# Patient Record
Sex: Male | Born: 1995 | Race: Black or African American | Hispanic: No | Marital: Single | Smoking: Current every day smoker
Health system: Southern US, Community
[De-identification: ages and names within clinical notes are randomized; demographics above are authoritative.]

## PROBLEM LIST (undated history)

## (undated) DIAGNOSIS — Y249XXA Unspecified firearm discharge, undetermined intent, initial encounter: Secondary | ICD-10-CM

## (undated) DIAGNOSIS — W3400XA Accidental discharge from unspecified firearms or gun, initial encounter: Secondary | ICD-10-CM

## (undated) HISTORY — PX: CORNEAL TRANSPLANT: SHX108

---
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
## 2016-11-06 ENCOUNTER — Emergency Department (HOSPITAL_COMMUNITY): Payer: Self-pay

## 2016-11-06 ENCOUNTER — Encounter (HOSPITAL_COMMUNITY): Payer: Self-pay | Admitting: Emergency Medicine

## 2016-11-06 ENCOUNTER — Emergency Department (HOSPITAL_COMMUNITY)
Admission: EM | Admit: 2016-11-06 | Discharge: 2016-11-06 | Disposition: A | Discharge status: Home / Self Care | Payer: Self-pay | Attending: Emergency Medicine | Admitting: Emergency Medicine

## 2016-11-06 DIAGNOSIS — R112 Nausea with vomiting, unspecified: Secondary | ICD-10-CM | POA: Insufficient documentation

## 2016-11-06 DIAGNOSIS — F17210 Nicotine dependence, cigarettes, uncomplicated: Secondary | ICD-10-CM | POA: Insufficient documentation

## 2016-11-06 DIAGNOSIS — R197 Diarrhea, unspecified: Secondary | ICD-10-CM | POA: Insufficient documentation

## 2016-11-06 DIAGNOSIS — Z202 Contact with and (suspected) exposure to infections with a predominantly sexual mode of transmission: Secondary | ICD-10-CM | POA: Insufficient documentation

## 2016-11-06 DIAGNOSIS — R1084 Generalized abdominal pain: Secondary | ICD-10-CM | POA: Insufficient documentation

## 2016-11-06 LAB — COMPREHENSIVE METABOLIC PANEL
ALBUMIN: 4.5 g/dL (ref 3.5–5.0)
ALT: 19 U/L (ref 17–63)
AST: 22 U/L (ref 15–41)
Alkaline Phosphatase: 79 U/L (ref 38–126)
Anion gap: 9 (ref 5–15)
BUN: 11 mg/dL (ref 6–20)
CHLORIDE: 105 mmol/L (ref 101–111)
CO2: 22 mmol/L (ref 22–32)
Calcium: 9.8 mg/dL (ref 8.9–10.3)
Creatinine, Ser: 0.96 mg/dL (ref 0.61–1.24)
GFR calc Af Amer: >60 mL/min (ref >60)
GLUCOSE: 108 mg/dL — AB (ref 65–99)
POTASSIUM: 4.3 mmol/L (ref 3.5–5.1)
Sodium: 136 mmol/L (ref 135–145)
Total Bilirubin: 0.6 mg/dL (ref 0.3–1.2)
Total Protein: 8.3 g/dL — ABNORMAL HIGH (ref 6.5–8.1)

## 2016-11-06 LAB — CBC
HEMATOCRIT: 43.7 % (ref 39.0–52.0)
Hemoglobin: 15.5 g/dL (ref 13.0–17.0)
MCH: 30.6 pg (ref 26.0–34.0)
MCHC: 35.5 g/dL (ref 30.0–36.0)
MCV: 86.2 fL (ref 78.0–100.0)
Platelets: 262 10*3/uL (ref 150–400)
RBC: 5.07 MIL/uL (ref 4.22–5.81)
RDW: 12 % (ref 11.5–15.5)
WBC: 19 10*3/uL — AB (ref 4.0–10.5)

## 2016-11-06 LAB — URINALYSIS, ROUTINE W REFLEX MICROSCOPIC
BILIRUBIN URINE: NEGATIVE
GLUCOSE, UA: NEGATIVE mg/dL
HGB URINE DIPSTICK: NEGATIVE
Ketones, ur: 5 mg/dL — AB
Leukocytes, UA: NEGATIVE
Nitrite: NEGATIVE
PH: 6 (ref 5.0–8.0)
Protein, ur: NEGATIVE mg/dL
Specific Gravity, Urine: 1.024 (ref 1.005–1.030)

## 2016-11-06 LAB — RAPID STREP SCREEN (MED CTR MEBANE ONLY): Streptococcus, Group A Screen (Direct): NEGATIVE

## 2016-11-06 LAB — LIPASE, BLOOD: LIPASE: 27 U/L (ref 11–51)

## 2016-11-06 MED ORDER — ONDANSETRON HCL 4 MG/2ML IJ SOLN
4.0000 mg | Freq: Once | INTRAMUSCULAR | Status: AC
  Administered 2016-11-06: 4 mg via INTRAVENOUS
  Filled 2016-11-06: qty 2

## 2016-11-06 MED ORDER — SODIUM CHLORIDE 0.9 % IV BOLUS (SEPSIS)
1000.0000 mL | Freq: Once | INTRAVENOUS | Status: AC
  Administered 2016-11-06: 1000 mL via INTRAVENOUS

## 2016-11-06 MED ORDER — KETOROLAC TROMETHAMINE 30 MG/ML IJ SOLN
30.0000 mg | Freq: Once | INTRAMUSCULAR | Status: AC
Start: 2016-11-06 — End: 2016-11-06
  Administered 2016-11-06: 30 mg via INTRAVENOUS
  Filled 2016-11-06: qty 1

## 2016-11-06 MED ORDER — ONDANSETRON HCL 4 MG PO TABS: 4.0000 mg | ORAL_TABLET | Freq: Four times a day (QID) | ORAL | 0 refills | Status: DC

## 2016-11-06 MED ORDER — IOPAMIDOL (ISOVUE-300) INJECTION 61%
100.0000 mL | Freq: Once | INTRAVENOUS | Status: AC | PRN
  Administered 2016-11-06: 100 mL via INTRAVENOUS

## 2016-11-06 MED ORDER — IOPAMIDOL (ISOVUE-300) INJECTION 61%
INTRAVENOUS | Status: DC
Start: 2016-11-06 — End: 2016-11-07
  Filled 2016-11-06: qty 100

## 2016-11-06 MED ORDER — DICYCLOMINE HCL 20 MG PO TABS: 20.0000 mg | ORAL_TABLET | Freq: Two times a day (BID) | ORAL | 0 refills | Status: DC

## 2016-11-06 MED ORDER — STERILE WATER FOR INJECTION IJ SOLN
INTRAMUSCULAR | Status: AC
  Administered 2016-11-06: 10 mL
  Filled 2016-11-06: qty 10

## 2016-11-06 MED ORDER — AZITHROMYCIN 250 MG PO TABS
1000.0000 mg | ORAL_TABLET | Freq: Every day | ORAL | Status: DC
  Administered 2016-11-06: 1000 mg via ORAL
  Filled 2016-11-06: qty 4

## 2016-11-06 MED ORDER — CEFTRIAXONE SODIUM 250 MG IJ SOLR
250.0000 mg | Freq: Once | INTRAMUSCULAR | Status: AC
  Administered 2016-11-06: 250 mg via INTRAMUSCULAR
  Filled 2016-11-06: qty 250

## 2016-11-06 NOTE — ED Provider Notes (Signed)
[facility]-EMERGENCY DEPT Provider Note   CSN: [id number] Arrival date & time: 11/06/16  1309     History   Chief Complaint Chief Complaint  Patient presents with  . Abdominal Pain  . Emesis  . Diarrhea  . Fatigue  . Exposure to STD    HPI Marcus Hodges is a 21 y.o. male who presents with generalized, intermittent abdominal pain, nausea/vomiting, diarrhea. Patient reports that the symptoms have been ongoing for the past week but have been worsened in the last 2-3 days. He describes pain as a generalized cramping. Patient states that the pain will sometimes radiate up into the right lateral side. He states he has had multiple episodes of vomiting but states that emesis is nonbloody, nonbilious. Patient has been able to tolerate minimal liquids but states that he is unable to tolerate any other by mouth. Patient reports he has had multiple episodes of diarrhea since onset but denies any blood in stool or melena. Patient states that he has not taken anything for the pain. Patient reports that he has had subjective fever but has not measured any temperature. Patient states that he also has had several days of sore throat. He is still able to swallow denies any neck swelling. Patient also concerned about possible STD exposure. He states that his girlfriend has been tested positive for Chlamydia and is concerned that he may now be positive for STDs. Patient denies any penile discharge, dysuria. Patient denies any dysuria, hematuria.  Patient states that he does smoke cigarettes. Patient denies any cocaine, marijuana, heroine use.   The history is provided by the patient and a relative.    History reviewed. No pertinent past medical history.  There are no active problems to display for this patient.   History reviewed. No pertinent surgical history.     Home Medications    Prior to Admission medications   Medication Sig Start Date End Date Taking? Authorizing Provider  dicyclomine (BENTYL) 20  MG tablet Take 1 tablet (20 mg total) by mouth 2 (two) times daily. 11/06/16   Maxwell Caul, PA-C  ibuprofen (ADVIL,MOTRIN) 600 MG tablet Take 1 tablet (600 mg total) by mouth every 6 (six) hours as needed for pain. Patient not taking: Reported on 11/06/2016 07/30/12   Kathrynn Speed, PA-C  methocarbamol (ROBAXIN) 500 MG tablet Take 1 tablet (500 mg total) by mouth 2 (two) times daily. Patient not taking: Reported on 11/06/2016 07/30/12   Hess, Nada Boozer, PA-C  ondansetron (ZOFRAN) 4 MG tablet Take 1 tablet (4 mg total) by mouth every 6 (six) hours. 11/06/16   Maxwell Caul, PA-C    Family History History reviewed. No pertinent family history.  Social History Social History  Substance Use Topics  . Smoking status: Current Every Day Smoker    Packs/day: 1.00    Years: 6.00    Types: Cigarettes  . Smokeless tobacco: Never Used  . Alcohol use Yes     Comment: 2 pints     Allergies   Patient has no known allergies.   Review of Systems Review of Systems  Constitutional: Positive for fever (subjective). Negative for chills.  HENT: Positive for sore throat. Negative for congestion, drooling and trouble swallowing.   Respiratory: Negative for shortness of breath.   Cardiovascular: Positive for chest pain.  Gastrointestinal: Positive for abdominal pain, diarrhea, nausea and vomiting. Negative for blood in stool.  Genitourinary: Negative for discharge, dysuria, hematuria, penile pain, penile swelling and testicular pain.  Musculoskeletal: Negative for  back pain and neck pain.  Neurological: Negative for weakness, numbness and headaches.     Physical Exam Updated Vital Signs BP 119/71   Pulse 65   Temp 98.5 F (36.9 C) (Oral)   Resp (!) 25   SpO2 100%   Physical Exam  Constitutional: He is oriented to person, place, and time. He appears well-developed and well-nourished.  Appears uncomfortable but no acute distress   HENT:  Head: Normocephalic and atraumatic.  Mouth/Throat:  Uvula is midline and mucous membranes are normal. No trismus in the jaw. Posterior oropharyngeal edema and posterior oropharyngeal erythema present. No oropharyngeal exudate.  Uvula is midline. No trismus. No evidence of peritonsillar abscess. No facial or neck swelling. Posterior oropharynx is slightly edematous and erythematous. No evidence of exudate.  Eyes: Pupils are equal, round, and reactive to light. Conjunctivae, EOM and lids are normal.  Neck: Full passive range of motion without pain. Neck supple. No neck rigidity.  Full flexion/extension and lateral movement of neck fully intact. No bony midline tenderness. No deformities or crepitus.   Cardiovascular: Normal rate, regular rhythm, normal heart sounds and normal pulses.  Exam reveals no gallop and no friction rub.   No murmur heard. Pulmonary/Chest: Effort normal and breath sounds normal.  No evidence of respiratory distress. Able to speak in full sentences without difficulty.  Abdominal: Soft. Normal appearance. There is generalized tenderness. There is no rigidity, no guarding, no CVA tenderness and no tenderness at McBurney's point. Hernia confirmed negative in the right inguinal area and confirmed negative in the left inguinal area.  Abdomen is soft, nondistended. He has diffuse generalized tenderness.  Genitourinary: Testes normal and penis normal. Right testis shows no swelling and no tenderness. Left testis shows no swelling and no tenderness. Circumcised.  Genitourinary Comments: The exam was performed with a chaperone present. Normal external male genitalia.  Musculoskeletal: Normal range of motion.  Neurological: He is alert and oriented to person, place, and time.  Follows commands, Moves all extremities  5/5 strength to BUE and BLE  Sensation intact throughout all major nerve distributions  Skin: Skin is warm and dry. Capillary refill takes less than 2 seconds.  Psychiatric: He has a normal mood and affect. His speech is  normal.  Nursing note and vitals reviewed.    ED Treatments / Results  Labs (all labs ordered are listed, but only abnormal results are displayed) Labs Reviewed  COMPREHENSIVE METABOLIC PANEL - Abnormal; Notable for the following:       Result Value   Glucose, Bld 108 (*)    Total Protein 8.3 (*)    All other components within normal limits  CBC - Abnormal; Notable for the following:    WBC 19.0 (*)    All other components within normal limits  URINALYSIS, ROUTINE W REFLEX MICROSCOPIC - Abnormal; Notable for the following:    Ketones, ur 5 (*)    All other components within normal limits  RAPID STREP SCREEN (NOT AT [facility])  CULTURE, GROUP A STREP ([facility])  LIPASE, BLOOD  HIV ANTIBODY (ROUTINE TESTING)  GC/CHLAMYDIA PROBE AMP ([place]) NOT AT [facility]    EKG  EKG Interpretation None       Radiology Dg Chest 2 View  Result Date: 11/06/2016 CLINICAL DATA:  Fatigue, nausea and vomiting for several days, feeling unwell. EXAM: CHEST  2 VIEW COMPARISON:  None. FINDINGS: The heart size and mediastinal contours are within normal limits. Both lungs are clear. The visualized skeletal structures are unremarkable. IMPRESSION: Normal  chest. Electronically Signed   By: Awilda Metroourtnay  Bloomer M.D.   On: 11/06/2016 19:06   Ct Abdomen Pelvis W Contrast  Result Date: 11/06/2016 CLINICAL DATA:  Abdominal pain. Nausea and vomiting for several days. Fatigue. EXAM: CT ABDOMEN AND PELVIS WITH CONTRAST TECHNIQUE: Multidetector CT imaging of the abdomen and pelvis was performed using the standard protocol following bolus administration of intravenous contrast. CONTRAST:  100mL ISOVUE-300 IOPAMIDOL (ISOVUE-300) INJECTION 61% COMPARISON:  None. FINDINGS: Lower chest: The lung bases are clear. Hepatobiliary: Minimal focal fatty infiltration adjacent with falciform ligament. No suspicious hepatic lesion. No gallstones, gallbladder wall thickening, or biliary dilatation. Pancreas: No ductal dilatation or  inflammation. Spleen: Normal in size without focal abnormality. Adrenals/Urinary Tract: Normal adrenal glands. No hydronephrosis or perinephric edema. Motion artifact through the lower right kidney. Ureters are decompressed. No evident urolithiasis. Urinary bladder is minimally distended. No bladder wall thickening. Stomach/Bowel: Stomach is nondistended. Fluid-filled nondilated small bowel in the lower abdomen. No bowel inflammation or wall thickening. Normal appendix. The colon is nondistended and not well evaluated. No colonic inflammation. Vascular/Lymphatic: No significant vascular findings are present. No enlarged abdominal or pelvic lymph nodes. Reproductive: Prostate is unremarkable. Other: No free air, free fluid, or intra-abdominal fluid collection. Musculoskeletal: There are no acute or suspicious osseous abnormalities. Hemi transitional lumbosacral anatomy is incidentally noted. IMPRESSION: 1. Fluid-filled small bowel wall in the lower abdomen which is nondilated, may be normal or seen with enteritis. No evidence of bowel inflammation. 2. Otherwise unremarkable CT of the abdomen/pelvis. Electronically Signed   By: Rubye OaksMelanie  Ehinger M.D.   On: 11/06/2016 19:19    Procedures Procedures (including critical care time)  Medications Ordered in ED Medications  iopamidol (ISOVUE-300) 61 % injection (not administered)  azithromycin (ZITHROMAX) tablet 1,000 mg (1,000 mg Oral Given 11/06/16 2158)  sodium chloride 0.9 % bolus 1,000 mL (1,000 mLs Intravenous New Bag/Given 11/06/16 2000)  ondansetron (ZOFRAN) injection 4 mg (4 mg Intravenous Given 11/06/16 2001)  ketorolac (TORADOL) 30 MG/ML injection 30 mg (30 mg Intravenous Given 11/06/16 2001)  iopamidol (ISOVUE-300) 61 % injection 100 mL (100 mLs Intravenous Contrast Given 11/06/16 1900)  cefTRIAXone (ROCEPHIN) injection 250 mg (250 mg Intramuscular Given 11/06/16 2158)  sterile water (preservative free) injection (10 mLs  Given 11/06/16 2158)     Initial  Impression / Assessment and Plan / ED Course  I have reviewed the triage vital signs and the nursing notes.  Pertinent labs & imaging results that were available during my care of the patient were reviewed by me and considered in my medical decision making (see chart for details).     21 year old male who presents with abdominal pain, nausea/vomiting/diarrhea. Abdominal pain has been ongoing for the last week but worse in the last 2-3 days. Subjective fevers at home. Patient also concerned about STD exposure and would like to be tested for STDs. Patient also complaining of sore throat. Patient is afebrile, non-toxic appearing, sitting comfortably on examination table. Vital signs reviewed and stable. Consider acute infectious etiology vs viral GI process vs UTI. History/physical examination concerning for diverticulitis, appendicitis, small bowel obstruction. Plan to check basic labs including CBC, CMP, lipase, UA, rapid strep, chest x-ray, EKG. Patient also requesting being tested for gonorrhea, chlamydia and HIV today. Given tenderness and duration of symptoms, plan to check CT abdomen.   UA reviewed. Negative for any acute signs of infection. CMP reviewed and unremarkable. CBC shows white blood cell count of 19.0. Lipase is unremarkable. Rapid strep is negative. Chest x-ray negative  for any acute infectious etiology. EKG showed sinus arrhythmia, rate 64. CT abdomen shows normal appendix, no evidence of small bowel obstruction. There is mention of fluid-filled small bowel that can be seen and normal variants or enteritis. Discussed results with patient and mom. Will plan to by mouth challenge. Discussed with patient. He reports improvement in pain after medication in the department. Since his girlfriend has been exposed with gonorrhea and chlamydia, he would like to be treated in the department today. Plan to provide treatment.  Patient able to tolerate PO without any difficulty. Vital stable. Patient  stable for discharge. Provided patient with a list of clinic resources to use if he does not have a PCP. Instructed to call them today to arrange follow-up in the next 24-48 hours. Plan to run symptomatic treatment. Strict return precautions discussed. Patient expresses understanding and agreement to plan.    Final Clinical Impressions(s) / ED Diagnoses   Final diagnoses:  Generalized abdominal pain  Nausea vomiting and diarrhea  Exposure to STD    New Prescriptions New Prescriptions   DICYCLOMINE (BENTYL) 20 MG TABLET    Take 1 tablet (20 mg total) by mouth 2 (two) times daily.   ONDANSETRON (ZOFRAN) 4 MG TABLET    Take 1 tablet (4 mg total) by mouth every 6 (six) hours.     Maxwell Caul, PA-C 11/07/16 0220    Melene Plan, DO 11/07/16 1500

## 2016-11-06 NOTE — ED Notes (Signed)
Per mother, patient doesn't want an IV. Lillia AbedMade Lindsey PA aware.

## 2016-11-06 NOTE — ED Triage Notes (Signed)
Pt c/o abdominal pain, fatigue and nausea / vomiting x several days. Pt also states he would like to be tested for STDs. States he has had worsening fatigue and unable to eat or drink for several days.

## 2016-11-06 NOTE — ED Notes (Signed)
Patient given sprite for po challenge.

## 2016-11-06 NOTE — ED Notes (Signed)
Patient transported to CT 

## 2016-11-06 NOTE — ED Notes (Signed)
ED Provider at bedside. 

## 2016-11-06 NOTE — Discharge Instructions (Signed)
As we discussed, the SCD results will take 2-3 days return. If they're positive, she will be notified. You have artery received treatment here in the department do not need to seek treatment. Do not have any sexual intercourse for the next 7 days. Her partner will need to be treated also. Denies any sexual intercourse and clear partners treated also.  Take Zofran as directed for nausea and vomiting.  Take Bentyl as directed for abdominal pain.  Make sure you're staying hydrated.  Follow-up with you're primary care doctor if he do not have a primary care doctor he can use the clinic listed above.  Return to emergency department for any worsening abdominal pain, fever, chest pain, difficulty breathing, difficulty walking, pain with urination, blood in her urine, persistent vomiting or any other worsening or concerning symptoms.

## 2016-11-07 LAB — HIV ANTIBODY (ROUTINE TESTING W REFLEX): HIV SCREEN 4TH GENERATION: NONREACTIVE

## 2016-11-09 LAB — CULTURE, GROUP A STREP (THRC)

## 2016-11-12 LAB — GC/CHLAMYDIA PROBE AMP (~~LOC~~) NOT AT ARMC
CHLAMYDIA, DNA PROBE: NEGATIVE
Neisseria Gonorrhea: NEGATIVE

## 2016-11-26 ENCOUNTER — Ambulatory Visit (INDEPENDENT_AMBULATORY_CARE_PROVIDER_SITE_OTHER): Payer: Self-pay | Admitting: Pediatrics

## 2016-11-26 ENCOUNTER — Encounter: Payer: Self-pay | Admitting: Pediatrics

## 2016-11-26 VITALS — BP 122/71 | HR 79 | Ht 68.0 in | Wt 190.2 lb

## 2016-11-26 DIAGNOSIS — Z202 Contact with and (suspected) exposure to infections with a predominantly sexual mode of transmission: Secondary | ICD-10-CM

## 2016-11-26 DIAGNOSIS — Z113 Encounter for screening for infections with a predominantly sexual mode of transmission: Secondary | ICD-10-CM

## 2016-11-26 MED ORDER — CEFTRIAXONE SODIUM 250 MG IJ SOLR
250.0000 mg | Freq: Once | INTRAMUSCULAR | Status: AC
  Administered 2016-11-26: 250 mg via INTRAMUSCULAR

## 2016-11-26 MED ORDER — AZITHROMYCIN 500 MG PO TABS
1000.0000 mg | ORAL_TABLET | Freq: Once | ORAL | Status: AC
  Administered 2016-11-26: 1000 mg via ORAL
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
## 2016-11-26 NOTE — Patient Instructions (Signed)
No sex for the next 14 days. Always use condoms. Come back and see [place] if you need [place]. We will call you with your lab results.

## 2016-11-27 LAB — RPR: RPR: NONREACTIVE

## 2016-11-27 LAB — HIV ANTIBODY (ROUTINE TESTING W REFLEX): HIV 1&2 Ab, 4th Generation: NONREACTIVE

## 2016-11-27 LAB — C. TRACHOMATIS/N. GONORRHOEAE RNA
C. TRACHOMATIS RNA, TMA: NOT DETECTED
N. gonorrhoeae RNA, TMA: NOT DETECTED

## 2017-03-31 ENCOUNTER — Observation Stay (HOSPITAL_COMMUNITY)
Admission: EM | Admit: 2017-03-31 | Discharge: 2017-04-01 | Disposition: A | Discharge status: Other Acute Inpatient Hospital | Payer: Self-pay | Attending: General Surgery | Admitting: General Surgery

## 2017-03-31 DIAGNOSIS — S0240CA Maxillary fracture, right side, initial encounter for closed fracture: Secondary | ICD-10-CM | POA: Insufficient documentation

## 2017-03-31 DIAGNOSIS — S0183XA Puncture wound without foreign body of other part of head, initial encounter: Secondary | ICD-10-CM

## 2017-03-31 DIAGNOSIS — S0121XA Laceration without foreign body of nose, initial encounter: Principal | ICD-10-CM | POA: Insufficient documentation

## 2017-03-31 DIAGNOSIS — S0542XA Penetrating wound of orbit with or without foreign body, left eye, initial encounter: Secondary | ICD-10-CM | POA: Insufficient documentation

## 2017-03-31 DIAGNOSIS — Y92009 Unspecified place in unspecified non-institutional (private) residence as the place of occurrence of the external cause: Secondary | ICD-10-CM | POA: Insufficient documentation

## 2017-03-31 DIAGNOSIS — R040 Epistaxis: Secondary | ICD-10-CM | POA: Insufficient documentation

## 2017-03-31 DIAGNOSIS — Z23 Encounter for immunization: Secondary | ICD-10-CM | POA: Insufficient documentation

## 2017-03-31 DIAGNOSIS — T1592XA Foreign body on external eye, part unspecified, left eye, initial encounter: Secondary | ICD-10-CM

## 2017-03-31 DIAGNOSIS — W3400XA Accidental discharge from unspecified firearms or gun, initial encounter: Secondary | ICD-10-CM

## 2017-04-01 ENCOUNTER — Encounter (HOSPITAL_COMMUNITY): Payer: Self-pay | Admitting: Emergency Medicine

## 2017-04-01 ENCOUNTER — Other Ambulatory Visit: Payer: Self-pay | Admitting: Ophthalmology

## 2017-04-01 ENCOUNTER — Emergency Department (HOSPITAL_COMMUNITY): Payer: Self-pay

## 2017-04-01 ENCOUNTER — Other Ambulatory Visit: Payer: Self-pay | Admitting: General Surgery

## 2017-04-01 DIAGNOSIS — S0183XA Puncture wound without foreign body of other part of head, initial encounter: Secondary | ICD-10-CM

## 2017-04-01 DIAGNOSIS — W3400XA Accidental discharge from unspecified firearms or gun, initial encounter: Secondary | ICD-10-CM

## 2017-04-01 LAB — TYPE AND SCREEN
ABO/RH(D): A POS
ANTIBODY SCREEN: NEGATIVE
Unit division: 0
Unit division: 0

## 2017-04-01 LAB — PREPARE FRESH FROZEN PLASMA
UNIT DIVISION: 0
Unit division: 0

## 2017-04-01 LAB — CBC
HCT: 40.8 % (ref 39.0–52.0)
Hemoglobin: 14 g/dL (ref 13.0–17.0)
MCH: 30.4 pg (ref 26.0–34.0)
MCHC: 34.3 g/dL (ref 30.0–36.0)
MCV: 88.5 fL (ref 78.0–100.0)
PLATELETS: 274 10*3/uL (ref 150–400)
RBC: 4.61 MIL/uL (ref 4.22–5.81)
RDW: 12 % (ref 11.5–15.5)
WBC: 10.1 10*3/uL (ref 4.0–10.5)

## 2017-04-01 LAB — URINALYSIS, ROUTINE W REFLEX MICROSCOPIC
Bilirubin Urine: NEGATIVE
GLUCOSE, UA: NEGATIVE mg/dL
Ketones, ur: NEGATIVE mg/dL
Leukocytes, UA: NEGATIVE
NITRITE: NEGATIVE
Protein, ur: NEGATIVE mg/dL
SPECIFIC GRAVITY, URINE: 1.019 (ref 1.005–1.030)
Squamous Epithelial / LPF: NONE SEEN
pH: 6 (ref 5.0–8.0)

## 2017-04-01 LAB — I-STAT CHEM 8, ED
BUN: 14 mg/dL (ref 6–20)
Calcium, Ion: 1.09 mmol/L — ABNORMAL LOW (ref 1.15–1.40)
Chloride: 101 mmol/L (ref 101–111)
Creatinine, Ser: 0.9 mg/dL (ref 0.61–1.24)
GLUCOSE: 132 mg/dL — AB (ref 65–99)
HEMATOCRIT: 42 % (ref 39.0–52.0)
Hemoglobin: 14.3 g/dL (ref 13.0–17.0)
Potassium: 2.8 mmol/L — ABNORMAL LOW (ref 3.5–5.1)
Sodium: 138 mmol/L (ref 135–145)
TCO2: 23 mmol/L (ref 22–32)

## 2017-04-01 LAB — BPAM FFP
BLOOD PRODUCT EXPIRATION DATE: 201901302359
BLOOD PRODUCT EXPIRATION DATE: 201901312359
ISSUE DATE / TIME: 201901272348
ISSUE DATE / TIME: 201901272348
UNIT TYPE AND RH: 6200
Unit Type and Rh: 6200

## 2017-04-01 LAB — BPAM RBC
BLOOD PRODUCT EXPIRATION DATE: 201902032359
Blood Product Expiration Date: 201902062359
ISSUE DATE / TIME: 201901272347
ISSUE DATE / TIME: 201901272347
UNIT TYPE AND RH: 9500
UNIT TYPE AND RH: 9500

## 2017-04-01 LAB — COMPREHENSIVE METABOLIC PANEL
ALT: 19 U/L (ref 17–63)
AST: 32 U/L (ref 15–41)
Albumin: 3.8 g/dL (ref 3.5–5.0)
Alkaline Phosphatase: 62 U/L (ref 38–126)
Anion gap: 12 (ref 5–15)
BUN: 12 mg/dL (ref 6–20)
CO2: 21 mmol/L — ABNORMAL LOW (ref 22–32)
CREATININE: 1.05 mg/dL (ref 0.61–1.24)
Calcium: 8.9 mg/dL (ref 8.9–10.3)
Chloride: 103 mmol/L (ref 101–111)
Glucose, Bld: 138 mg/dL — ABNORMAL HIGH (ref 65–99)
POTASSIUM: 2.9 mmol/L — AB (ref 3.5–5.1)
Sodium: 136 mmol/L (ref 135–145)
TOTAL PROTEIN: 6.5 g/dL (ref 6.5–8.1)
Total Bilirubin: 0.5 mg/dL (ref 0.3–1.2)

## 2017-04-01 LAB — PROTIME-INR
INR: 1.07
Prothrombin Time: 13.8 seconds (ref 11.4–15.2)

## 2017-04-01 LAB — POTASSIUM: Potassium: 3.1 mmol/L — ABNORMAL LOW (ref 3.5–5.1)

## 2017-04-01 LAB — ETHANOL: Alcohol, Ethyl (B): <10 mg/dL (ref <10)

## 2017-04-01 LAB — I-STAT CG4 LACTIC ACID, ED: LACTIC ACID, VENOUS: 3.34 mmol/L — AB (ref 0.5–1.9)

## 2017-04-01 LAB — MAGNESIUM: MAGNESIUM: 1.8 mg/dL (ref 1.7–2.4)

## 2017-04-01 LAB — ABO/RH: ABO/RH(D): A POS

## 2017-04-01 MED ORDER — POTASSIUM CHLORIDE 10 MEQ/100ML IV SOLN
10.0000 meq | INTRAVENOUS | Status: DC
  Administered 2017-04-01: 10 meq via INTRAVENOUS
  Filled 2017-04-01: qty 100

## 2017-04-01 MED ORDER — SODIUM CHLORIDE 0.9 % IV SOLN
INTRAVENOUS | Status: DC
  Administered 2017-04-01: 02:00:00 via INTRAVENOUS

## 2017-04-01 MED ORDER — SODIUM CHLORIDE 0.9 % IV BOLUS (SEPSIS)
1000.0000 mL | Freq: Once | INTRAVENOUS | Status: AC
  Administered 2017-04-01: 1000 mL via INTRAVENOUS

## 2017-04-01 MED ORDER — BACITRACIN ZINC 500 UNIT/GM EX OINT: TOPICAL_OINTMENT | Freq: Two times a day (BID) | CUTANEOUS | Status: DC

## 2017-04-01 MED ORDER — TETANUS-DIPHTH-ACELL PERTUSSIS 5-2.5-18.5 LF-MCG/0.5 IM SUSP
0.5000 mL | Freq: Once | INTRAMUSCULAR | Status: AC
  Administered 2017-04-01: 0.5 mL via INTRAMUSCULAR

## 2017-04-01 MED ORDER — CEFAZOLIN SODIUM-DEXTROSE 1-4 GM/50ML-% IV SOLN: 1.0000 g | Freq: Three times a day (TID) | INTRAVENOUS | Status: DC

## 2017-04-01 MED ORDER — MAGNESIUM SULFATE 2 GM/50ML IV SOLN
2.0000 g | Freq: Once | INTRAVENOUS | Status: AC
  Administered 2017-04-01: 2 g via INTRAVENOUS
  Filled 2017-04-01: qty 50

## 2017-04-01 MED ORDER — FENTANYL CITRATE (PF) 100 MCG/2ML IJ SOLN
100.0000 ug | Freq: Once | INTRAMUSCULAR | Status: AC
  Administered 2017-04-01: 100 ug via INTRAVENOUS

## 2017-04-01 MED ORDER — CLINDAMYCIN PHOSPHATE 600 MG/50ML IV SOLN
600.0000 mg | Freq: Once | INTRAVENOUS | Status: AC
  Administered 2017-04-01: 600 mg via INTRAVENOUS

## 2017-04-01 NOTE — ED Notes (Signed)
Opthalmology at bedside.
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
## 2017-04-01 NOTE — ED Notes (Signed)
4N RN notified [facility]

## 2017-04-01 NOTE — ED Notes (Signed)
Attempts made to call pts mother vanessa, no answer. Cell phone x 2 sent with patient.
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
## 2017-04-01 NOTE — ED Notes (Signed)
Plastics at bedside

## 2017-04-01 NOTE — ED Notes (Signed)
Vital signs stable. 
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
## 2017-04-01 NOTE — ED Notes (Signed)
[facility] at bedside for transport. 
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
## 2017-04-03 LAB — CDS SEROLOGY

## 2017-04-15 ENCOUNTER — Encounter: Payer: Self-pay | Admitting: Pediatrics

## 2017-05-12 ENCOUNTER — Other Ambulatory Visit: Payer: Self-pay

## 2017-05-12 ENCOUNTER — Encounter (HOSPITAL_COMMUNITY): Payer: Self-pay | Admitting: Emergency Medicine

## 2017-05-12 DIAGNOSIS — F17210 Nicotine dependence, cigarettes, uncomplicated: Secondary | ICD-10-CM | POA: Insufficient documentation

## 2017-05-12 DIAGNOSIS — H5712 Ocular pain, left eye: Secondary | ICD-10-CM | POA: Insufficient documentation
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
## 2017-05-13 ENCOUNTER — Emergency Department (HOSPITAL_COMMUNITY)
Admission: EM | Admit: 2017-05-13 | Discharge: 2017-05-13 | Disposition: A | Discharge status: Home / Self Care | Payer: Self-pay | Attending: Emergency Medicine | Admitting: Emergency Medicine

## 2017-05-13 DIAGNOSIS — H5712 Ocular pain, left eye: Secondary | ICD-10-CM

## 2017-05-13 HISTORY — DX: Accidental discharge from unspecified firearms or gun, initial encounter: W34.00XA

## 2017-05-13 HISTORY — DX: Unspecified firearm discharge, undetermined intent, initial encounter: Y24.9XXA

## 2017-05-13 MED ORDER — FLUORESCEIN SODIUM 1 MG OP STRP
1.0000 | ORAL_STRIP | Freq: Once | OPHTHALMIC | Status: AC
  Administered 2017-05-13: 1 via OPHTHALMIC
  Filled 2017-05-13: qty 1

## 2017-05-13 MED ORDER — OXYCODONE-ACETAMINOPHEN 5-325 MG PO TABS
1.0000 | ORAL_TABLET | Freq: Once | ORAL | Status: AC
  Administered 2017-05-13: 1 via ORAL
  Filled 2017-05-13: qty 1

## 2017-05-13 MED ORDER — TETRACAINE HCL 0.5 % OP SOLN
2.0000 [drp] | Freq: Once | OPHTHALMIC | Status: AC
  Administered 2017-05-13: 2 [drp] via OPHTHALMIC
  Filled 2017-05-13: qty 4

## 2017-05-13 NOTE — ED Notes (Addendum)
Pt is in waiting area.  Came back and said he was going to stay per Prom.  Given warm blanket and socks per request.
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
## 2017-05-13 NOTE — ED Notes (Signed)
Pt stated he was leaving due to a long wait. Pt was encouraged to stay, though pt was seen walking out the door.

## 2017-05-13 NOTE — Discharge Instructions (Addendum)
[facility]. If you don't see your ophthalmologist you may lose your eye.

## 2017-05-13 NOTE — ED Notes (Signed)
Contacted [facility] line for Ophthalmology consult

## 2017-07-27 ENCOUNTER — Emergency Department (HOSPITAL_COMMUNITY): Admission: EM | Admit: 2017-07-27 | Discharge: 2017-07-27 | Discharge status: Against Medical Advice | Payer: Self-pay

## 2017-07-27 NOTE — ED Notes (Signed)
Pt called for triage no answer  °

## 2017-08-13 ENCOUNTER — Ambulatory Visit: Payer: Self-pay

## 2018-10-21 IMAGING — CT CT ABD-PELV W/ CM
2 of 4 series · 16 of 46 positions shown, 18 images · IV contrast (ISOVUE)
Comparison: None.

CLINICAL DATA: Abdominal pain. Nausea and vomiting for several
days. Fatigue.

EXAM:
CT ABDOMEN AND PELVIS WITH CONTRAST
TECHNIQUE: Multidetector CT imaging of the abdomen and pelvis was performed
using the standard protocol following bolus administration of
intravenous contrast.
CONTRAST:  100mL BPJYH6-VYY IOPAMIDOL (BPJYH6-VYY) INJECTION 61%

[Series 2: abd/pel with · axial · 0.72mm/px · z∈[+1024,+1439]mm · 13 of 93 slices shown, 15 images]
[im 5/93  soft-tissue]
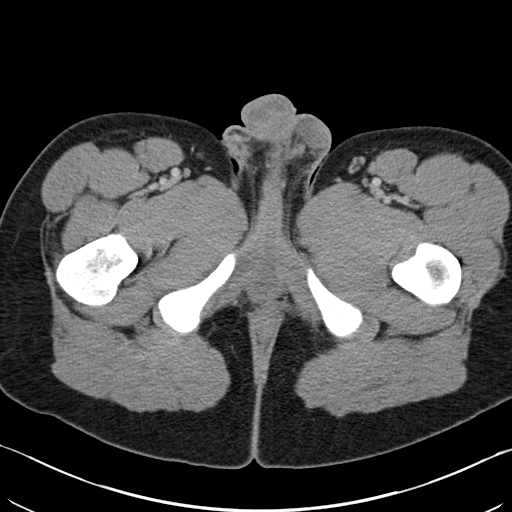
[im 5/93  bone]
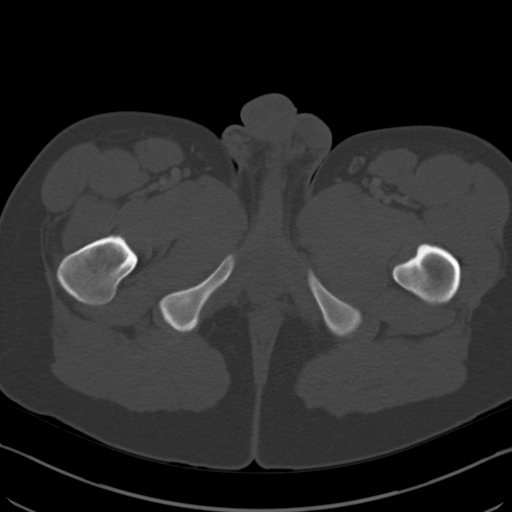
[im 14/93  soft-tissue]
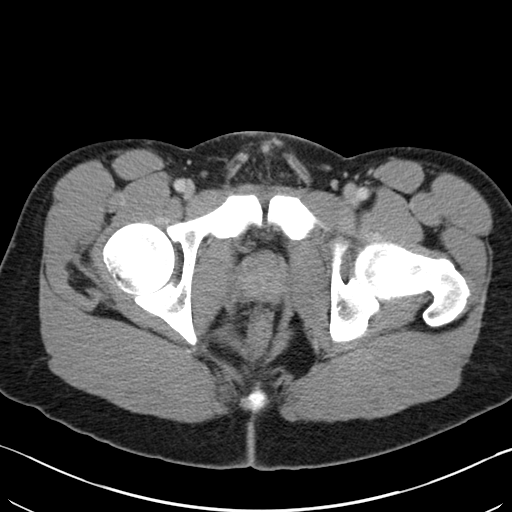
[im 18/93  soft-tissue]
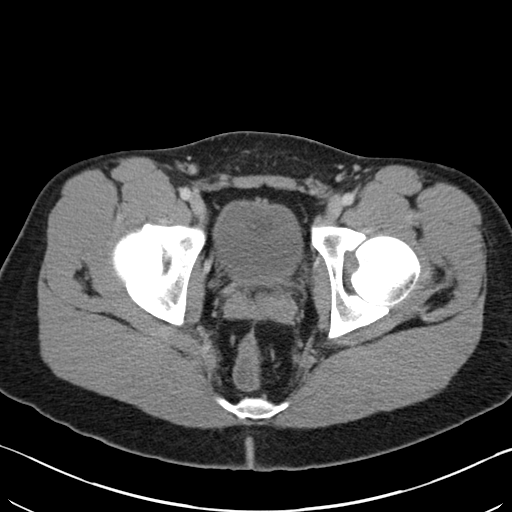
[im 27/93  soft-tissue]
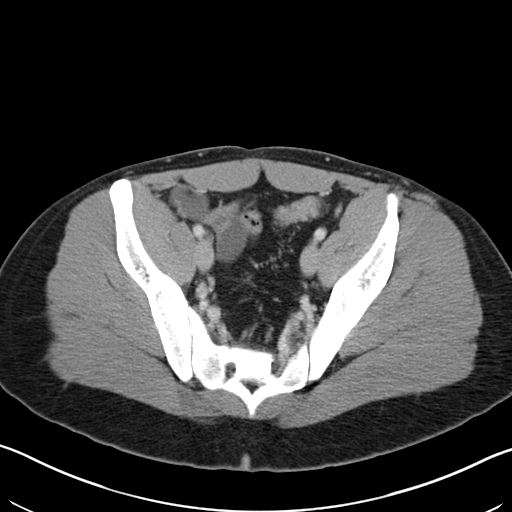
[im 31/93  soft-tissue]
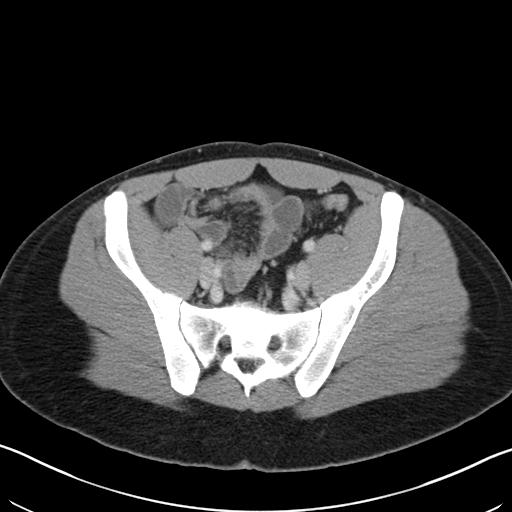
[im 40/93  soft-tissue]
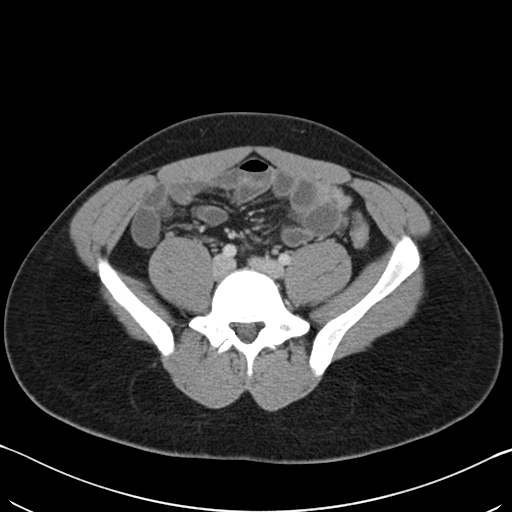
[im 49/93  soft-tissue]
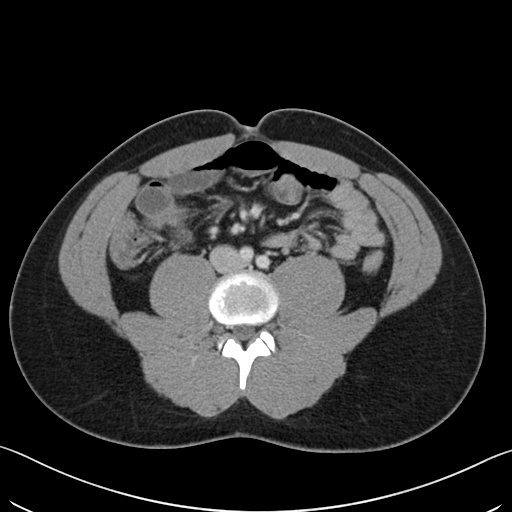
[im 53/93  soft-tissue]
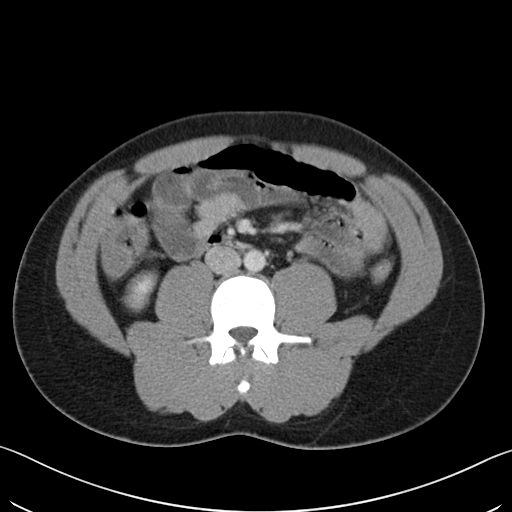
[im 62/93  soft-tissue]
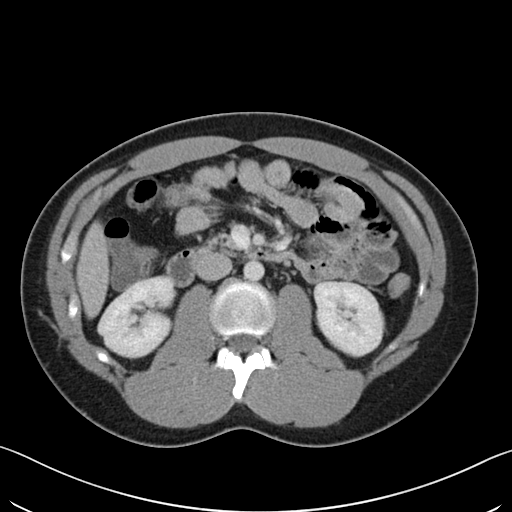
[im 62/93  bone]
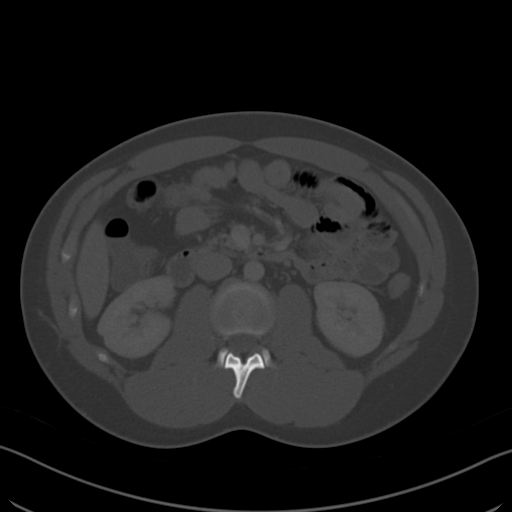
[im 66/93  soft-tissue]
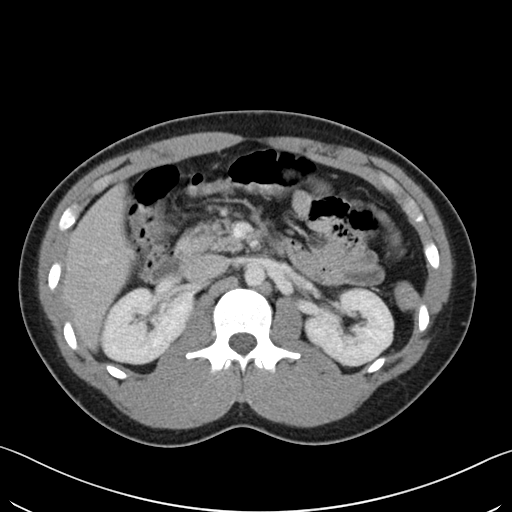
[im 75/93  soft-tissue]
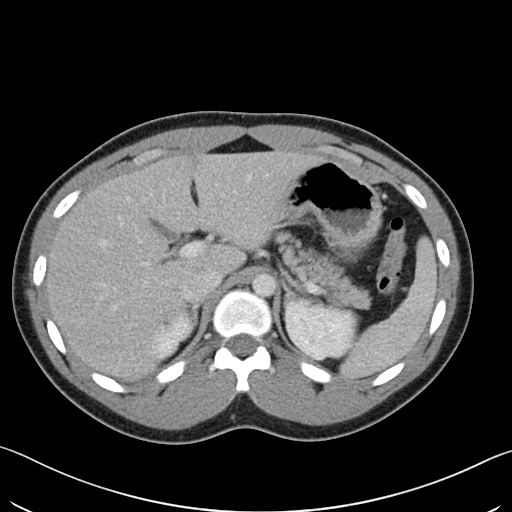
[im 79/93  soft-tissue]
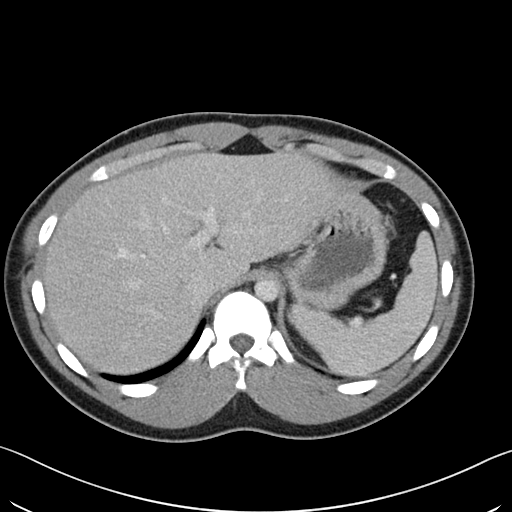
[im 88/93  soft-tissue]
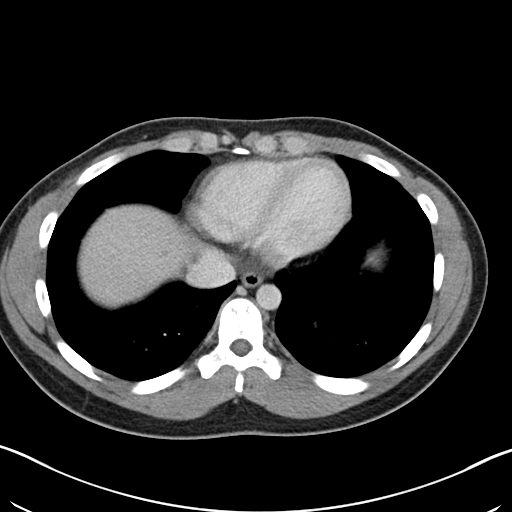

[Series 5: coronal a/|p · coronal · 0.68mm/px · 3 of 127 slices shown]
[im 43/127  soft-tissue]
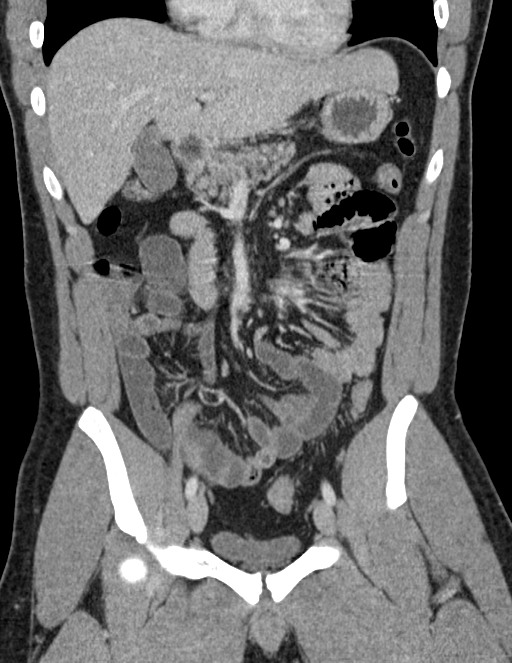
[im 57/127  soft-tissue]
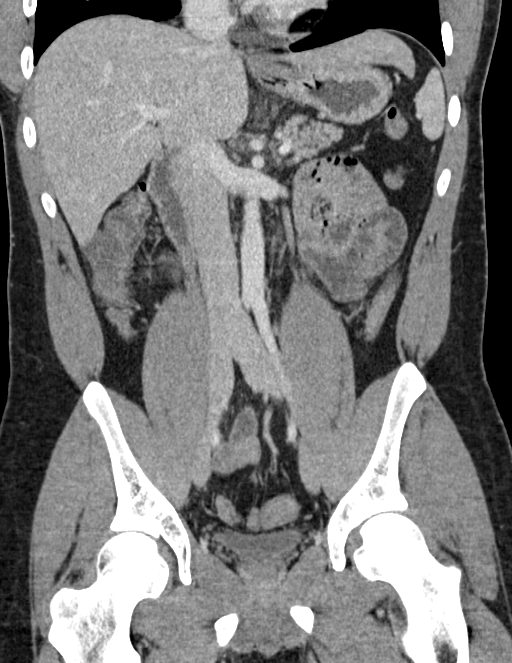
[im 71/127  soft-tissue]
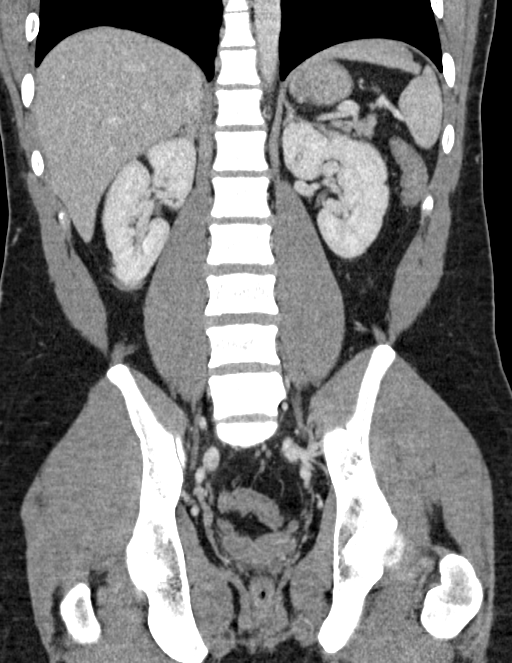

[16 of 46 positions shown; findings below may reference images not displayed]

FINDINGS: Lower chest: The lung bases are clear.

Hepatobiliary: Minimal focal fatty infiltration adjacent with
falciform ligament. No suspicious hepatic lesion. No gallstones,
gallbladder wall thickening, or biliary dilatation.

Pancreas: No ductal dilatation or inflammation.

Spleen: Normal in size without focal abnormality.

Adrenals/Urinary Tract: Normal adrenal glands. No hydronephrosis or
perinephric edema. Motion artifact through the lower right kidney.
Ureters are decompressed. No evident urolithiasis. Urinary bladder
is minimally distended. No bladder wall thickening.

Stomach/Bowel: Stomach is nondistended. Fluid-filled nondilated
small bowel in the lower abdomen. No bowel inflammation or wall
thickening. Normal appendix. The colon is nondistended and not well
evaluated. No colonic inflammation.

Vascular/Lymphatic: No significant vascular findings are present. No
enlarged abdominal or pelvic lymph nodes.

Reproductive: Prostate is unremarkable.

Other: No free air, free fluid, or intra-abdominal fluid collection.

Musculoskeletal: There are no acute or suspicious osseous
abnormalities. Hemi transitional lumbosacral anatomy is incidentally
noted.
IMPRESSION: 1. Fluid-filled small bowel wall in the lower abdomen which is
nondilated, may be normal or seen with enteritis. No evidence of
bowel inflammation.
2. Otherwise unremarkable CT of the abdomen/pelvis.
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
## 2020-08-23 ENCOUNTER — Ambulatory Visit (HOSPITAL_COMMUNITY)
Admission: RE | Admit: 2020-08-23 | Discharge: 2020-08-23 | Disposition: A | Discharge status: Home / Self Care | Payer: Self-pay | Attending: Psychiatry | Admitting: Psychiatry

## 2020-08-23 NOTE — Progress Notes (Signed)
Patient presents as a walk in for assessment at 1415. Upon retrieval of patient from lobby, patient expresses that he no longer desires to be seen. Informed of standard process, including vital signs, TTS counselor assessment, and TTS provider assessment. Patient verbalizes understanding. Insists that he desires to leave, citing that he has to go to his daughter. MSE waiver obtained and witnessed by this Clinical research associate. Patient ambulatory and in no acute distress upon departure from lobby.

## 2020-08-23 NOTE — Progress Notes (Signed)
Patient declines vital signs.

## 2022-11-20 ENCOUNTER — Other Ambulatory Visit: Payer: Self-pay

## 2022-11-20 ENCOUNTER — Emergency Department (HOSPITAL_COMMUNITY)
Admission: EM | Admit: 2022-11-20 | Discharge: 2022-11-21 | Disposition: A | Discharge status: Home / Self Care | Payer: Self-pay | Attending: Emergency Medicine | Admitting: Emergency Medicine

## 2022-11-20 DIAGNOSIS — R519 Headache, unspecified: Secondary | ICD-10-CM | POA: Insufficient documentation

## 2022-11-20 MED ORDER — ACETAMINOPHEN 325 MG PO TABS
650.0000 mg | ORAL_TABLET | Freq: Once | ORAL | Status: AC
  Administered 2022-11-20: 650 mg via ORAL
  Filled 2022-11-20: qty 2
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
## 2022-11-21 ENCOUNTER — Emergency Department (HOSPITAL_COMMUNITY): Payer: Self-pay

## 2022-11-21 MED ORDER — METOCLOPRAMIDE HCL 10 MG PO TABS
10.0000 mg | ORAL_TABLET | Freq: Once | ORAL | Status: AC
  Administered 2022-11-21: 10 mg via ORAL
  Filled 2022-11-21: qty 1

## 2022-11-21 MED ORDER — KETOROLAC TROMETHAMINE 60 MG/2ML IM SOLN
60.0000 mg | Freq: Once | INTRAMUSCULAR | Status: AC
  Administered 2022-11-21: 60 mg via INTRAMUSCULAR
  Filled 2022-11-21: qty 2

## 2022-11-21 MED ORDER — DIPHENHYDRAMINE HCL 12.5 MG/5ML PO ELIX
12.5000 mg | ORAL_SOLUTION | Freq: Once | ORAL | Status: AC
  Administered 2022-11-21: 12.5 mg via ORAL
  Filled 2022-11-21: qty 10

## 2022-11-21 MED ORDER — NAPROXEN 500 MG PO TABS: 500.0000 mg | ORAL_TABLET | Freq: Two times a day (BID) | ORAL | 0 refills | Status: AC

## 2022-11-21 NOTE — ED Notes (Signed)
Patient taken to CT at this time.
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
## 2023-04-11 ENCOUNTER — Emergency Department (HOSPITAL_COMMUNITY)
Admission: EM | Admit: 2023-04-11 | Discharge: 2023-04-12 | Disposition: A | Discharge status: Home / Self Care | Payer: Medicaid Other | Attending: Emergency Medicine | Admitting: Emergency Medicine

## 2023-04-11 ENCOUNTER — Other Ambulatory Visit: Payer: Self-pay

## 2023-04-11 DIAGNOSIS — R5383 Other fatigue: Secondary | ICD-10-CM | POA: Insufficient documentation

## 2023-04-11 DIAGNOSIS — R000 Tachycardia, unspecified: Secondary | ICD-10-CM | POA: Diagnosis not present

## 2023-04-11 LAB — CBC WITH DIFFERENTIAL/PLATELET
Abs Immature Granulocytes: 0.05 10*3/uL (ref 0.00–0.07)
Basophils Absolute: 0 10*3/uL (ref 0.0–0.1)
Basophils Relative: 1 %
Eosinophils Absolute: 0.2 10*3/uL (ref 0.0–0.5)
Eosinophils Relative: 2 %
HCT: 45.8 % (ref 39.0–52.0)
Hemoglobin: 15.6 g/dL (ref 13.0–17.0)
Immature Granulocytes: 1 %
Lymphocytes Relative: 34 %
Lymphs Abs: 2.3 10*3/uL (ref 0.7–4.0)
MCH: 29.5 pg (ref 26.0–34.0)
MCHC: 34.1 g/dL (ref 30.0–36.0)
MCV: 86.7 fL (ref 80.0–100.0)
Monocytes Absolute: 0.6 10*3/uL (ref 0.1–1.0)
Monocytes Relative: 9 %
Neutro Abs: 3.6 10*3/uL (ref 1.7–7.7)
Neutrophils Relative %: 53 %
Platelets: 319 10*3/uL (ref 150–400)
RBC: 5.28 MIL/uL (ref 4.22–5.81)
RDW: 11.7 % (ref 11.5–15.5)
WBC: 6.8 10*3/uL (ref 4.0–10.5)
nRBC: 0 % (ref 0.0–0.2)

## 2023-04-11 LAB — COMPREHENSIVE METABOLIC PANEL
ALT: 32 U/L (ref 0–44)
AST: 25 U/L (ref 15–41)
Albumin: 3.8 g/dL (ref 3.5–5.0)
Alkaline Phosphatase: 47 U/L (ref 38–126)
Anion gap: 12 (ref 5–15)
BUN: 11 mg/dL (ref 6–20)
CO2: 24 mmol/L (ref 22–32)
Calcium: 9.6 mg/dL (ref 8.9–10.3)
Chloride: 102 mmol/L (ref 98–111)
Creatinine, Ser: 1.07 mg/dL (ref 0.61–1.24)
GFR, Estimated: >60 mL/min (ref >60)
Glucose, Bld: 112 mg/dL — ABNORMAL HIGH (ref 70–99)
Potassium: 4.1 mmol/L (ref 3.5–5.1)
Sodium: 138 mmol/L (ref 135–145)
Total Bilirubin: 0.6 mg/dL (ref 0.0–1.2)
Total Protein: 6.6 g/dL (ref 6.5–8.1)

## 2023-04-11 LAB — TSH: TSH: 1.629 u[IU]/mL (ref 0.350–4.500)
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
## 2023-04-12 NOTE — Discharge Instructions (Addendum)
Follow up with your family doc in the office.  
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
## 2024-01-21 ENCOUNTER — Emergency Department (HOSPITAL_COMMUNITY)
Admission: EM | Admit: 2024-01-21 | Discharge: 2024-01-21 | Disposition: A | Discharge status: Home / Self Care | Attending: Emergency Medicine | Admitting: Emergency Medicine

## 2024-01-21 ENCOUNTER — Other Ambulatory Visit: Payer: Self-pay

## 2024-01-21 ENCOUNTER — Ambulatory Visit
Admission: EM | Admit: 2024-01-21 | Discharge: 2024-01-21 | Disposition: A | Discharge status: Home / Self Care | Attending: Urgent Care | Admitting: Urgent Care

## 2024-01-21 ENCOUNTER — Encounter (HOSPITAL_COMMUNITY): Payer: Self-pay

## 2024-01-21 ENCOUNTER — Emergency Department (HOSPITAL_COMMUNITY)

## 2024-01-21 DIAGNOSIS — R410 Disorientation, unspecified: Secondary | ICD-10-CM

## 2024-01-21 DIAGNOSIS — R079 Chest pain, unspecified: Secondary | ICD-10-CM

## 2024-01-21 DIAGNOSIS — Z79899 Other long term (current) drug therapy: Secondary | ICD-10-CM | POA: Diagnosis not present

## 2024-01-21 DIAGNOSIS — R0789 Other chest pain: Secondary | ICD-10-CM

## 2024-01-21 DIAGNOSIS — R55 Syncope and collapse: Secondary | ICD-10-CM | POA: Insufficient documentation

## 2024-01-21 LAB — CBC WITH DIFFERENTIAL/PLATELET
Abs Immature Granulocytes: 0.02 K/uL (ref 0.00–0.07)
Basophils Absolute: 0 K/uL (ref 0.0–0.1)
Basophils Relative: 1 %
Eosinophils Absolute: 0.1 K/uL (ref 0.0–0.5)
Eosinophils Relative: 2 %
HCT: 44.7 % (ref 39.0–52.0)
Hemoglobin: 15.6 g/dL (ref 13.0–17.0)
Immature Granulocytes: 0 %
Lymphocytes Relative: 35 %
Lymphs Abs: 2.3 K/uL (ref 0.7–4.0)
MCH: 30.1 pg (ref 26.0–34.0)
MCHC: 34.9 g/dL (ref 30.0–36.0)
MCV: 86.1 fL (ref 80.0–100.0)
Monocytes Absolute: 0.6 K/uL (ref 0.1–1.0)
Monocytes Relative: 10 %
Neutro Abs: 3.4 K/uL (ref 1.7–7.7)
Neutrophils Relative %: 52 %
Platelets: 318 K/uL (ref 150–400)
RBC: 5.19 MIL/uL (ref 4.22–5.81)
RDW: 11.9 % (ref 11.5–15.5)
WBC: 6.5 K/uL (ref 4.0–10.5)
nRBC: 0 % (ref 0.0–0.2)

## 2024-01-21 LAB — COMPREHENSIVE METABOLIC PANEL WITH GFR
ALT: 35 U/L (ref 0–44)
AST: 27 U/L (ref 15–41)
Albumin: 4.1 g/dL (ref 3.5–5.0)
Alkaline Phosphatase: 72 U/L (ref 38–126)
Anion gap: 15 (ref 5–15)
BUN: 11 mg/dL (ref 6–20)
CO2: 18 mmol/L — ABNORMAL LOW (ref 22–32)
Calcium: 9.5 mg/dL (ref 8.9–10.3)
Chloride: 102 mmol/L (ref 98–111)
Creatinine, Ser: 0.79 mg/dL (ref 0.61–1.24)
GFR, Estimated: >60 mL/min (ref >60)
Glucose, Bld: 103 mg/dL — ABNORMAL HIGH (ref 70–99)
Potassium: 4.3 mmol/L (ref 3.5–5.1)
Sodium: 135 mmol/L (ref 135–145)
Total Bilirubin: 0.8 mg/dL (ref 0.0–1.2)
Total Protein: 7.4 g/dL (ref 6.5–8.1)

## 2024-01-21 LAB — RAPID URINE DRUG SCREEN, HOSP PERFORMED
Amphetamines: NOT DETECTED
Barbiturates: NOT DETECTED
Benzodiazepines: NOT DETECTED
Cocaine: NOT DETECTED
Opiates: NOT DETECTED
Tetrahydrocannabinol: NOT DETECTED

## 2024-01-21 LAB — URINALYSIS, ROUTINE W REFLEX MICROSCOPIC
Bilirubin Urine: NEGATIVE
Glucose, UA: NEGATIVE mg/dL
Hgb urine dipstick: NEGATIVE
Ketones, ur: NEGATIVE mg/dL
Nitrite: NEGATIVE
Protein, ur: NEGATIVE mg/dL
Specific Gravity, Urine: 1.015 (ref 1.005–1.030)
pH: 5 (ref 5.0–8.0)

## 2024-01-21 LAB — GLUCOSE, POCT (MANUAL RESULT ENTRY): POC Glucose: 120 mg/dL — AB (ref 70–99)

## 2024-01-21 LAB — TROPONIN I (HIGH SENSITIVITY)
Troponin I (High Sensitivity): 3 ng/L (ref <18)
Troponin I (High Sensitivity): 3 ng/L (ref <18)

## 2024-01-21 MED ORDER — ALUM & MAG HYDROXIDE-SIMETH 200-200-20 MG/5ML PO SUSP
30.0000 mL | Freq: Once | ORAL | Status: AC
  Administered 2024-01-21: 30 mL via ORAL
  Filled 2024-01-21: qty 30

## 2024-01-21 MED ORDER — LIDOCAINE VISCOUS HCL 2 % MT SOLN
15.0000 mL | Freq: Once | OROMUCOSAL | Status: AC
  Administered 2024-01-21: 15 mL via ORAL
  Filled 2024-01-21: qty 15

## 2024-01-21 MED ORDER — FAMOTIDINE 20 MG PO TABS
20.0000 mg | ORAL_TABLET | Freq: Once | ORAL | Status: AC
  Administered 2024-01-21: 20 mg via ORAL
  Filled 2024-01-21: qty 1

## 2024-01-21 NOTE — ED Triage Notes (Signed)
 Pt co syncope and cp, HA (pressure) it feels like my eyes are about to shoot out of my head; ongoing since this am; symptoms started after drinking water ; Denies hx of same; sent by urgent care for further evaluation   Pt gives verbal consent for mse
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
## 2024-01-21 NOTE — Discharge Instructions (Addendum)
 It was a pleasure taking care of you today.  Based on your history and physical exam I feel you are safe for discharge.  Your workup today in the emergency department was reassuring.  There was no indication of a cardiac etiology for your pain today.  Please continue to monitor your symptoms.  Your pain however did improve with GI medications that treat things like excess stomach acid and reflux here in the emergency department.  These medications were offered to you however you stated you wanted to follow-up with a primary care doctor.  Please establish with a primary care doctor as soon as possible, recommend follow-up within the next 1 to 2 weeks for postdischarge visit and recheck.  Please return to the emergency department if you experiencing the following symptoms including but not limited to further syncope, worsening chest pain, shortness of breath, abdominal pain, unexplained weakness, seizures, or other concerning symptom.  Please maintain a good diet and good fluid intake.

## 2024-01-21 NOTE — ED Notes (Signed)
 Patient is being discharged from the Urgent Care and sent to the Emergency Department via POV . Per [place] PA C, patient is in need of higher level of care due to syncope, head and chest pressure. Patient is aware and verbalizes understanding of plan of care.  Vitals:   01/21/24 1002  BP: 137/85  Pulse: 90  Resp: 20  Temp: 99.8 F (37.7 C)  SpO2: 96%

## 2024-01-21 NOTE — ED Notes (Signed)
 Endorses sx improved. HA, nausea improved. Denies sob or dizziness. CP remains, but less/ improved.

## 2024-01-21 NOTE — Discharge Instructions (Signed)
 Please head straight to the emergency room now. You have concerning symptoms that together with your loss of consciousness requires a higher level of testing and care than we can provide in the urgent care setting.
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
